# Patient Record
Sex: Male | Born: 1995 | Race: Black or African American | Hispanic: No | Marital: Single | Smoking: Current every day smoker
Health system: Southern US, Community
[De-identification: ages and names within clinical notes are randomized; demographics above are authoritative.]

---
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
## 2017-08-07 ENCOUNTER — Encounter (HOSPITAL_COMMUNITY): Payer: Self-pay | Admitting: *Deleted

## 2017-08-07 ENCOUNTER — Emergency Department (HOSPITAL_COMMUNITY)
Admission: EM | Admit: 2017-08-07 | Discharge: 2017-08-07 | Disposition: A | Discharge status: Home / Self Care | Payer: BLUE CROSS/BLUE SHIELD | Attending: Emergency Medicine | Admitting: Emergency Medicine

## 2017-08-07 DIAGNOSIS — R21 Rash and other nonspecific skin eruption: Secondary | ICD-10-CM | POA: Insufficient documentation

## 2017-08-07 DIAGNOSIS — J020 Streptococcal pharyngitis: Secondary | ICD-10-CM | POA: Insufficient documentation

## 2017-08-07 DIAGNOSIS — R590 Localized enlarged lymph nodes: Secondary | ICD-10-CM | POA: Insufficient documentation

## 2017-08-07 DIAGNOSIS — R6883 Chills (without fever): Secondary | ICD-10-CM | POA: Insufficient documentation

## 2017-08-07 DIAGNOSIS — A389 Scarlet fever, uncomplicated: Secondary | ICD-10-CM | POA: Insufficient documentation

## 2017-08-07 LAB — RAPID STREP SCREEN (MED CTR MEBANE ONLY): Streptococcus, Group A Screen (Direct): POSITIVE — AB

## 2017-08-07 MED ORDER — DEXAMETHASONE 4 MG PO TABS
10.0000 mg | ORAL_TABLET | Freq: Once | ORAL | Status: AC
  Administered 2017-08-07: 10 mg via ORAL
  Filled 2017-08-07: qty 2

## 2017-08-07 MED ORDER — PENICILLIN G BENZATHINE 1200000 UNIT/2ML IM SUSP
1.2000 10*6.[IU] | Freq: Once | INTRAMUSCULAR | Status: AC
Start: 2017-08-07 — End: 2017-08-07
  Administered 2017-08-07: 1.2 10*6.[IU] via INTRAMUSCULAR
  Filled 2017-08-07: qty 2

## 2017-08-07 NOTE — ED Provider Notes (Signed)
[facility]-EMERGENCY DEPT Provider Note   CSN: [id number] Arrival date & time: 08/07/17  1106     History   Chief Complaint Chief Complaint  Patient presents with  . Sore Throat  . Rash    HPI Mark Church is a 21 y.o. male who presents to the ED with a sore throat and rash. Patient reports that the sore throat started about 3 or 4 days ago. Initially he had headache, chills, ? Fever and aching all over. The headache and aching improved with tylenol and ibuprofen but the sore throat has continued with gland swelling and now he reports a fine rash that is all over his body.   The history is provided by the patient. No language interpreter was used.  Sore Throat  This is a new problem. The current episode started more than 2 days ago. The problem occurs constantly. The problem has not changed since onset.Pertinent negatives include no chest pain, no abdominal pain and no headaches. The symptoms are aggravated by swallowing. Nothing relieves the symptoms. He has tried acetaminophen for the symptoms.  Rash      History reviewed. No pertinent past medical history.  There are no active problems to display for this patient.   History reviewed. No pertinent surgical history.     Home Medications    Prior to Admission medications   Not on File    Family History No family history on file.  Social History Social History  Substance Use Topics  . Smoking status: Never Smoker  . Smokeless tobacco: Never Used  . Alcohol use No     Allergies   Other   Review of Systems Review of Systems  Constitutional: Positive for chills. Fever: ?  HENT: Positive for congestion and sore throat. Negative for dental problem, ear pain, facial swelling and trouble swallowing.   Eyes: Negative for photophobia, pain, discharge, redness, itching and visual disturbance.  Respiratory: Negative for cough.   Cardiovascular: Negative for chest pain and  palpitations.  Gastrointestinal: Negative for abdominal pain, nausea and vomiting.  Genitourinary: Negative for decreased urine volume, dysuria, frequency and urgency.  Musculoskeletal: Positive for myalgias.  Skin: Positive for rash.  Neurological: Negative for dizziness, syncope and headaches.  Hematological: Positive for adenopathy.  Psychiatric/Behavioral: Negative for confusion. The patient is not nervous/anxious.      Physical Exam Updated Vital Signs Ht 5\' 4"  (1.626 m)   Wt 73 kg (161 lb)   BMI 27.64 kg/m   Physical Exam  Constitutional: He appears well-developed and well-nourished. No distress.  HENT:  Head: Normocephalic and atraumatic.  Right Ear: Tympanic membrane normal.  Left Ear: Tympanic membrane normal.  Nose: Nose normal. Right sinus exhibits no maxillary sinus tenderness and no frontal sinus tenderness. Left sinus exhibits no maxillary sinus tenderness and no frontal sinus tenderness.  Mouth/Throat: Uvula is midline and mucous membranes are normal. Oropharyngeal exudate and posterior oropharyngeal erythema present. No tonsillar abscesses. Tonsils are 2+ on the right. Tonsils are 2+ on the left. Tonsillar exudate.  Eyes: Conjunctivae and EOM are normal. Right eye exhibits no discharge. Left eye exhibits no discharge.  Neck: Normal range of motion. Neck supple.  Cardiovascular: Normal rate and regular rhythm.   Pulmonary/Chest: Effort normal and breath sounds normal.  Abdominal: Soft. There is no tenderness.  Musculoskeletal: Normal range of motion.  Lymphadenopathy:    He has cervical adenopathy.  Neurological: He is alert.  Skin: Skin is warm and dry. Rash noted.  Generalized fine  sandpaper like rash.   Psychiatric: He has a normal mood and affect. His behavior is normal.  Nursing note and vitals reviewed.    ED Treatments / Results  Labs (all labs ordered are listed, but only abnormal results are displayed) Labs Reviewed  RAPID STREP SCREEN (NOT [facility]  [facility]) - Abnormal; Notable for the following:       Result Value   Streptococcus, Group A Screen (Direct) POSITIVE (*)    All other components within normal limits    Radiology No results found.  Procedures Procedures (including critical care time)  Medications Ordered in ED Medications  penicillin g benzathine (BICILLIN LA) 1200000 UNIT/2ML injection 1.2 Million Units (not administered)  dexamethasone (DECADRON) tablet 10 mg (not administered)     Initial Impression / Assessment and Plan / ED Course  I have reviewed the triage vital signs and the nursing notes. Pt with tonsillar exudate, cervical lymphadenopathy, & dysphagia; diagnosis of bacterial pharyngitis. Treated in the ED with injection of penicillin 1.2 M/U and decadron 10 mg.  Pt appears mildly dehydrated, discussed importance of water rehydration. Presentation non concerning for PTA or RPA. No trismus or uvula deviation. Specific return precautions discussed. Pt able to drink water in ED without difficulty with intact air way. Recommended PCP follow up.   Final Clinical Impressions(s) / ED Diagnoses   Final diagnoses:  Strep pharyngitis  Scarlet fever    New Prescriptions New Prescriptions   No medications on file     Janne Napoleoneese, Hope M, NP 08/07/17 1211    Tilden Fossaees, Elizabeth, MD 08/08/17 [phone]
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
## 2017-08-07 NOTE — ED Notes (Signed)
Patient is alert and oriented x3.  He was given DC instructions and follow up visit instructions.  Patient gave verbal understanding.  He was DC ambulatory under his own power to home.  V/S stable.  He was not showing any signs of distress on DC 

## 2017-08-07 NOTE — Discharge Instructions (Signed)
Take tylenol or ibuprofen as needed for pain or fever. Return as needed for worsening symptoms.

## 2017-08-07 NOTE — ED Notes (Signed)
Bed: WTR7 Expected date:  Expected time:  Means of arrival:  Comments: 

## 2021-09-04 ENCOUNTER — Ambulatory Visit (INDEPENDENT_AMBULATORY_CARE_PROVIDER_SITE_OTHER): Payer: Self-pay

## 2021-09-04 ENCOUNTER — Ambulatory Visit (HOSPITAL_COMMUNITY)
Admission: EM | Admit: 2021-09-04 | Discharge: 2021-09-04 | Disposition: A | Discharge status: Home / Self Care | Payer: Self-pay | Attending: Student | Admitting: Student

## 2021-09-04 ENCOUNTER — Other Ambulatory Visit: Payer: Self-pay

## 2021-09-04 ENCOUNTER — Encounter (HOSPITAL_COMMUNITY): Payer: Self-pay

## 2021-09-04 DIAGNOSIS — L089 Local infection of the skin and subcutaneous tissue, unspecified: Secondary | ICD-10-CM

## 2021-09-04 DIAGNOSIS — F17210 Nicotine dependence, cigarettes, uncomplicated: Secondary | ICD-10-CM

## 2021-09-04 DIAGNOSIS — R059 Cough, unspecified: Secondary | ICD-10-CM

## 2021-09-04 MED ORDER — HIBICLENS 4 % EX LIQD: Freq: Every day | CUTANEOUS | 0 refills | Status: AC

## 2021-09-04 MED ORDER — DOXYCYCLINE HYCLATE 100 MG PO CAPS: 100.0000 mg | ORAL_CAPSULE | Freq: Two times a day (BID) | ORAL | 0 refills | Status: AC
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
## 2021-09-04 NOTE — Discharge Instructions (Addendum)
-  Your chest xray was normal. The black specks in your sputum are from smoking.  For you skin infection: -Doxycycline twice daily for 7 days.  Make sure to wear sunscreen while spending time outside while on this medication as it can increase your chance of sunburn. You can take this medication with food if you have a sensitive stomach. -Hibiclens bodywash

## 2021-09-04 NOTE — ED Triage Notes (Signed)
Pt has a "bump" to the back of the upper right arm and right lower leg. Pt states that they randomly pop up. Pt states the one on the back of the rt upper arm has been there since August. Pt has random rashes in other areas. Pt states that at times they itch and nothing seems to help.  Pt states that when he coughs up stuff it has brownish colored specks.
